# Patient Record
Sex: Female | Born: 1995
Health system: Southern US, Community
[De-identification: ages and names within clinical notes are randomized; demographics above are authoritative.]

## PROBLEM LIST (undated history)

## (undated) DIAGNOSIS — E119 Type 2 diabetes mellitus without complications: Secondary | ICD-10-CM

## (undated) HISTORY — PX: BREAST SURGERY: SHX581

---
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
## 2023-08-24 ENCOUNTER — Ambulatory Visit
Admission: RE | Admit: 2023-08-24 | Discharge: 2023-08-24 | Disposition: A | Discharge status: Home / Self Care | Source: Ambulatory Visit

## 2023-08-24 DIAGNOSIS — M545 Low back pain, unspecified: Secondary | ICD-10-CM

## 2023-08-24 DIAGNOSIS — R519 Headache, unspecified: Secondary | ICD-10-CM
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
## 2023-09-07 ENCOUNTER — Ambulatory Visit: Payer: Self-pay

## 2023-09-20 ENCOUNTER — Ambulatory Visit: Payer: Self-pay

## 2023-09-22 ENCOUNTER — Ambulatory Visit: Payer: Self-pay

## 2023-10-21 ENCOUNTER — Other Ambulatory Visit: Payer: Self-pay

## 2023-10-21 ENCOUNTER — Ambulatory Visit
Admission: RE | Admit: 2023-10-21 | Discharge: 2023-10-21 | Disposition: A | Discharge status: Home / Self Care | Payer: Self-pay | Attending: Internal Medicine | Admitting: Internal Medicine

## 2023-10-21 VITALS — BP 160/101 | HR 85 | Temp 98.2°F | Resp 16

## 2023-10-21 DIAGNOSIS — I10 Essential (primary) hypertension: Secondary | ICD-10-CM | POA: Diagnosis present

## 2023-10-21 DIAGNOSIS — N898 Other specified noninflammatory disorders of vagina: Secondary | ICD-10-CM | POA: Insufficient documentation

## 2023-10-21 DIAGNOSIS — Z794 Long term (current) use of insulin: Secondary | ICD-10-CM | POA: Insufficient documentation

## 2023-10-21 DIAGNOSIS — E1165 Type 2 diabetes mellitus with hyperglycemia: Secondary | ICD-10-CM | POA: Insufficient documentation

## 2023-10-21 DIAGNOSIS — Z113 Encounter for screening for infections with a predominantly sexual mode of transmission: Secondary | ICD-10-CM | POA: Diagnosis not present

## 2023-10-21 MED ORDER — DOXYCYCLINE HYCLATE 100 MG PO CAPS: 100.0000 mg | ORAL_CAPSULE | Freq: Two times a day (BID) | ORAL | 0 refills | Status: AC

## 2023-10-21 NOTE — ED Provider Notes (Signed)
 [facility]  Note:  This document was prepared using Dragon voice recognition software and may include unintentional dictation errors.  MRN: [id number] DOB: 1995/02/15 DATE: 10/21/23   Subjective:  Chief Complaint:  Chief Complaint  Patient presents with   Groin Pain    Entered by patient   Abscess     HPI: Meredith Haley is a 28 y.o. female presenting for vaginal lesion for the past 2 weeks. Patient reports noticing a lesions to her right groin region for the past 2 weeks. Patient states that the area causes her no pain. She reports some irritation. She has tried soaking in the bath with no relief. Patient states she has had abscess/cyst in her axilla in the past as well as one in her groin before, but states that it has been a while. Patient is diabetic and states that she does not check her blood sugar. She does shave in the area and states it has been about 3 weeks since she last shaved. Patient reports no known STD exposures, but would like testing. Denies fever, nausea/vomiting, abdominal pain, vaginal discharge, vaginal odor, discharge. Endorses vaginal lesion. Presents NAD.  Prior to Admission medications   Medication Sig Start Date End Date Taking? Authorizing Provider  insulin aspart (NOVOLOG) 100 UNIT/ML injection Inject 1 Units into the skin 3 (three) times daily before meals. Sliding scale   Yes [provider]  amLODipine (NORVASC) 10 MG tablet Take 5 mg by mouth. 07/17/21   [provider]     Allergies  Allergen Reactions   Flavoring Agent Dermatitis and Hives    FRESH MANGOES, CANNED MANGOES  FRESH MANGOES, CANNED MANGOES  FRESH MANGOES, CANNED MANGOES FRESH MANGOES, CANNED MANGOES  FRESH MANGOES, CANNED MANGOES FRESH MANGOES, CANNED MANGOES  FRESH MANGOES, CANNED MANGOES  FRESH MANGOES, CANNED MANGOES  FRESH MANGOES, CANNED MANGOES  FRESH MANGOES, CANNED MANGOES    FRESH MANGOES, CANNED MANGOES FRESH MANGOES, CANNED MANGOES     History:   Past Medical History:  Diagnosis Date   Diabetes mellitus without complication (HCC)      Past Surgical History:  Procedure Laterality Date   BREAST SURGERY     CESAREAN SECTION      History reviewed. No pertinent family history.  Social History   Tobacco Use   Smoking status: Never    Passive exposure: Never   Smokeless tobacco: Never  Vaping Use   Vaping status: Never Used  Substance Use Topics   Alcohol use: Never   Drug use: Never    Review of Systems  Constitutional:  Positive for chills. Negative for fatigue and fever.  Gastrointestinal:  Negative for abdominal pain, nausea and vomiting.  Genitourinary:  Negative for dysuria, vaginal discharge and vaginal pain.       Vaginal lesion     Objective:   Vitals: BP (!) 160/101 (BP Location: Right Arm)   Pulse 85   Temp 98.2 F (36.8 C) (Oral)   Resp 16   LMP 09/28/2023   SpO2 98%   Physical Exam Exam conducted with a chaperone present Isidoro Knee, RN).  Constitutional:      General: She is not in acute distress.    Appearance: Normal appearance. She is well-developed and normal weight. She is not ill-appearing or toxic-appearing.  HENT:     Head: Normocephalic and atraumatic.  Cardiovascular:     Rate and Rhythm: Normal rate and regular rhythm.     Heart sounds: Normal heart sounds.  Pulmonary:  Effort: Pulmonary effort is normal.     Breath sounds: Normal breath sounds.     Comments: Clear to auscultation bilaterally  Abdominal:     General: Bowel sounds are normal.     Palpations: Abdomen is soft.     Tenderness: There is no abdominal tenderness.  Genitourinary:    Labia:        Right: Lesion present.       Comments: Patient has a firm,circular area to her right labia majora. Non fluctuant. Nontender to palpation. No warmth, erythema, or discharge.  Skin:    General: Skin is warm and dry.     Findings: Lesion present.  Neurological:     General: No focal deficit present.      Mental Status: She is alert.  Psychiatric:        Mood and Affect: Mood and affect normal.     Results:  Labs: No results found for this or any previous visit (from the past 24 hours).  Radiology: No results found.   UC Course/Treatments:  Procedures: Procedures   Medications Ordered in UC: Medications - No data to display   Assessment and Plan :     ICD-10-CM   1. Vaginal lesion  N89.8     2. Screening for STDs (sexually transmitted diseases)  Z11.3 RPR    HIV Antibody (routine testing w rflx)    HIV4GL Save Tube    RPR    HIV Antibody (routine testing w rflx)    HIV4GL Save Tube    3. Essential hypertension  I10      Vaginal lesion Screening for STDs (sexually transmitted disease) Afebrile, nontoxic-appearing, NAD. VSS. DDX includes but not limited to: HSV, abscess, bartholin cyst, sebaceous cyst, lymphadenopathy Based on presentation on exam, suspect sebaceous cyst versus lymphadenopathy. Given the area is firm and patient is having no pain as well as some extension anteriorly, suspect more of a cyst. However, given patient is an uncontrolled diabetic will cover for developing cellulitis/abscess given recent chills. Doxycycline  100mg  BID was prescribed. Recommend she follow up with GYN or general surgery for extraction given she is diabetic and uncontrolled. Cytology, HIV, and RPR are pending as well. Strict ED precautions were given and patient verbalized understanding.  Essential Hypertension Afebrile, nontoxic-appearing, NAD. VSS. Previously diagnosed.  Patient has not taken her BP medication at this time. Patient encourage to take her amlodipine as directed. Strict ED precautions were given and patient verbalized understanding.  ED Discharge Orders          Ordered    doxycycline  (VIBRAMYCIN ) 100 MG capsule  2 times daily        10/21/23 0927             PDMP not reviewed this encounter.     Basilia Ulanda SQUIBB, PA-C 10/21/23 9060

## 2023-10-21 NOTE — Discharge Instructions (Signed)
 Your swab and blood work were sent to the lab for further testing.  You will be called with results. You should avoid all sexual activity until you have been notified of all your results and have undergone any necessary treatment.  If you are positive, it is recommended that you inform all sexual partners so they can treat be treated as well before having sex again.   I have sent an antibiotic (Doxycycline ) for you to take by mouth for your lesion. Take as directed.   It is very important for you to pay attention to any new symptoms or worsening of your current condition. Please go directly to the Emergency Department immediately should you begin to feel worse in any way or have any of the following symptoms: persistent fevers, increased pain, increased swelling, increased redness, redness outside the demarcation or streaking down your leg.

## 2023-10-21 NOTE — ED Triage Notes (Signed)
 Patient states she has a cyst on her labia x 2 weeks. Patient states she has been having chills. Patient states she has had them in her axilla previously.

## 2023-10-21 NOTE — ED Notes (Signed)
 Chaperone visit for vaginal exam with [employer] PA

## 2023-10-22 LAB — CERVICOVAGINAL ANCILLARY ONLY
Chlamydia: NEGATIVE
Comment: NEGATIVE
Comment: NEGATIVE
Comment: NORMAL
Neisseria Gonorrhea: NEGATIVE
Trichomonas: NEGATIVE

## 2023-10-22 LAB — RPR: RPR Ser Ql: NONREACTIVE

## 2023-10-22 LAB — HIV ANTIBODY (ROUTINE TESTING W REFLEX): HIV Screen 4th Generation wRfx: NONREACTIVE
